# Patient Record
Sex: Male | Born: 1975 | Race: Black or African American | Hispanic: No | Marital: Single | Smoking: Current every day smoker
Health system: Southern US, Community
[De-identification: ages and names within clinical notes are randomized; demographics above are authoritative.]

## PROBLEM LIST (undated history)

## (undated) DIAGNOSIS — J40 Bronchitis, not specified as acute or chronic: Secondary | ICD-10-CM

## (undated) DIAGNOSIS — F410 Panic disorder [episodic paroxysmal anxiety] without agoraphobia: Secondary | ICD-10-CM

---

## 2013-07-09 ENCOUNTER — Encounter (HOSPITAL_COMMUNITY): Payer: Self-pay | Admitting: Emergency Medicine

## 2013-07-09 ENCOUNTER — Emergency Department (HOSPITAL_COMMUNITY)
Admission: EM | Admit: 2013-07-09 | Discharge: 2013-07-09 | Disposition: A | Discharge status: Home / Self Care | Payer: Self-pay | Attending: Emergency Medicine | Admitting: Emergency Medicine

## 2013-07-09 DIAGNOSIS — F17200 Nicotine dependence, unspecified, uncomplicated: Secondary | ICD-10-CM | POA: Insufficient documentation

## 2013-07-09 DIAGNOSIS — R0602 Shortness of breath: Secondary | ICD-10-CM | POA: Insufficient documentation

## 2013-07-09 DIAGNOSIS — Z8659 Personal history of other mental and behavioral disorders: Secondary | ICD-10-CM | POA: Insufficient documentation

## 2013-07-09 DIAGNOSIS — Z8709 Personal history of other diseases of the respiratory system: Secondary | ICD-10-CM | POA: Insufficient documentation

## 2013-07-09 HISTORY — DX: Panic disorder (episodic paroxysmal anxiety): F41.0

## 2013-07-09 HISTORY — DX: Bronchitis, not specified as acute or chronic: J40

## 2013-07-09 NOTE — ED Notes (Signed)
Pt alert, NAD, calm, interactive, resps e/u, speaking in clear complete sentences, ambulatory with steady gait, denies sx or complaints, (denies: recent illness, cough, congestion, cold sx, fever, nvd, pain, sob, dizziness or other sx), "think it was a panic attack, h/o same, last only a few minutes". No meds PTA. Friends at [employer].

## 2013-07-09 NOTE — ED Provider Notes (Signed)
CSN: [id number]     Arrival date & time 07/09/13  4098 History   First MD Initiated Contact with Patient 07/09/13 [phone]     Chief Complaint  Patient presents with  . Shortness of Breath   (Consider location/radiation/quality/duration/timing/severity/associated sxs/prior Treatment) HPI Comments: 37 yo AA male presents to ER with cc of SOB which has resolved.  Onset @ approx 2330 after pt got off the phone and received bad news.   No FHx of SCD or cardiac disease at young age.  No recent illness.  No trauma.  No CP.  Minimal clot risk factors except for smoking.    Patient is a 37 y.o. male presenting with shortness of breath. The history is provided by the patient.  Shortness of Breath Severity:  Mild Onset quality:  Gradual Duration:  7 hours Timing:  Intermittent Progression:  Resolved Chronicity:  New Context: emotional upset   Context: not activity, not animal exposure, not fumes, not known allergens, not occupational exposure, not pollens, not strong odors and not URI   Relieved by:  Nothing Worsened by:  Nothing tried Ineffective treatments:  None tried Associated symptoms: no abdominal pain, no chest pain, no claudication, no cough, no diaphoresis, no fever, no headaches, no hemoptysis, no neck pain, no sore throat, no sputum production, no syncope and no wheezing   Risk factors: tobacco use   Risk factors: no recent alcohol use, no family hx of DVT, no hx of cancer, no hx of PE/DVT, no obesity, no oral contraceptive use, no prolonged immobilization and no recent surgery     Past Medical History  Diagnosis Date  . Panic attack   . Bronchitis    History reviewed. No pertinent past surgical history. History reviewed. No pertinent family history. History  Substance Use Topics  . Smoking status: Current Every Day Smoker  . Smokeless tobacco: Not on file  . Alcohol Use: Yes     Comment: socially    Review of Systems  Constitutional: Negative for fever, chills,  diaphoresis, activity change, appetite change and fatigue.  HENT: Negative for sore throat and neck pain.   Eyes: Negative.   Respiratory: Positive for shortness of breath. Negative for apnea, cough, hemoptysis, sputum production, choking, chest tightness, wheezing and stridor.   Cardiovascular: Negative for chest pain, claudication and syncope.  Gastrointestinal: Negative for abdominal pain.  Endocrine: Negative.   Genitourinary: Negative.   Allergic/Immunologic: Negative.   Neurological: Negative.  Negative for dizziness, tremors, seizures, syncope, facial asymmetry, speech difficulty, light-headedness, numbness and headaches.  Hematological: Negative.   Psychiatric/Behavioral: Negative.     Allergies  Review of patient's allergies indicates no known allergies.  Home Medications  No current outpatient prescriptions on file. BP 130/63  Pulse 71  Temp(Src) 98 F (36.7 C) (Oral)  Resp 23  SpO2 93% Physical Exam  Constitutional: He is oriented to person, place, and time. He appears well-developed and well-nourished. No distress.  HENT:  Head: Normocephalic and atraumatic.  Eyes: Conjunctivae and EOM are normal. Pupils are equal, round, and reactive to light.  Neck: Normal range of motion. Neck supple.  Cardiovascular: Normal rate, regular rhythm, normal heart sounds and intact distal pulses.  Exam reveals no gallop and no friction rub.   No murmur heard. Pulmonary/Chest: Effort normal and breath sounds normal.  Abdominal: Soft. Bowel sounds are normal.  Musculoskeletal: Normal range of motion. He exhibits no edema and no tenderness.  No calf edema or TTP, symmetric pulses; no c/c/e  Neurological: He is alert  and oriented to person, place, and time. He has normal reflexes.  Skin: Skin is warm and dry. He is not diaphoretic.    Date: 07/09/2013@ 0540   Rate: 85  Rhythm: normal sinus rhythm  QRS Axis: normal  Intervals: normal  ST/T Wave abnormalities: normal  Conduction  Disutrbances:none  Narrative Interpretation:   Old EKG Reviewed: none available   ED Course  Procedures (including critical care time) Labs Review Labs Reviewed - No data to display Imaging Review No results found.  MDM   1. Shortness of breath    37 year old African American male presents emergency department with results shortness of breath.  Patient has minimal risk factors for ACS, PE, pneumonia, or other emergent etiology. The symptoms occurred after he received a phone call during which she received bad news about one of his children. He and his family members and walked to the emergency department for approximately 2 hours and his symptoms resolved during this walk. He is PERC negative.  His EKG is normal. His physical exam is normal. He has no history of cough and his lung exam is normal therefore I did not obtain a chest x-ray.  The patient also requested to not receive a chest x-ray. At this point the patient is stable for discharge to home. ER precautions were given for chest pain, shortness of breath, productive cough, syncope, or other concerning symptoms. I suspect his symptoms are likely related to anxiety, however they have completely resolved and he has been symptom-free for the last several hours.  Pt and family agree with the plan.     Darlys Gales, MD 07/09/13 [phone]

## 2013-07-09 NOTE — ED Notes (Signed)
Patient with shortness of breath since 1145 last evening.  Patient denies any chest pain.  Patient states he is lightheaded and broke out in a sweat.  Patient states he felt his heart racing, but all has now resolved.

## 2013-07-09 NOTE — ED Notes (Signed)
Report received, assumed care.  

## 2015-01-10 ENCOUNTER — Encounter (HOSPITAL_COMMUNITY): Payer: Self-pay | Admitting: *Deleted

## 2015-01-10 ENCOUNTER — Emergency Department (HOSPITAL_COMMUNITY)
Admission: EM | Admit: 2015-01-10 | Discharge: 2015-01-10 | Disposition: A | Discharge status: Home / Self Care | Payer: Self-pay | Attending: Emergency Medicine | Admitting: Emergency Medicine

## 2015-01-10 DIAGNOSIS — Z720 Tobacco use: Secondary | ICD-10-CM | POA: Insufficient documentation

## 2015-01-10 DIAGNOSIS — Z8709 Personal history of other diseases of the respiratory system: Secondary | ICD-10-CM | POA: Insufficient documentation

## 2015-01-10 DIAGNOSIS — Z202 Contact with and (suspected) exposure to infections with a predominantly sexual mode of transmission: Secondary | ICD-10-CM | POA: Insufficient documentation

## 2015-01-10 DIAGNOSIS — Z8659 Personal history of other mental and behavioral disorders: Secondary | ICD-10-CM | POA: Insufficient documentation

## 2015-01-10 MED ORDER — METRONIDAZOLE 500 MG PO TABS
2000.0000 mg | ORAL_TABLET | Freq: Once | ORAL | Status: AC
  Administered 2015-01-10: 2000 mg via ORAL
  Filled 2015-01-10: qty 4

## 2015-01-10 MED ORDER — CEFTRIAXONE SODIUM 250 MG IJ SOLR
250.0000 mg | Freq: Once | INTRAMUSCULAR | Status: AC
  Administered 2015-01-10: 250 mg via INTRAMUSCULAR
  Filled 2015-01-10: qty 250

## 2015-01-10 MED ORDER — AZITHROMYCIN 250 MG PO TABS
1000.0000 mg | ORAL_TABLET | Freq: Once | ORAL | Status: AC
  Administered 2015-01-10: 1000 mg via ORAL
  Filled 2015-01-10: qty 4

## 2015-01-10 NOTE — ED Provider Notes (Signed)
CSN: [id number]     Arrival date & time 01/10/15  1118 History  This chart was scribed for non-physician practitioner, Trixie DredgeEmily Malka Bocek, PA-C, working with Gilda Creasehristopher J Pollina, MD, by Lionel DecemberHatice Demirci, ED Scribe. This patient was seen in room TR07C/TR07C and the patient's care was started at 12:39 PM.   First MD Initiated Contact with Patient 01/10/15 1142     Chief Complaint  Patient presents with  . Exposure to STD     (Consider location/radiation/quality/duration/timing/severity/associated sxs/prior Treatment) HPI  HPI Comments: Fabiola BackerJuvone Swetz is a 39 y.o. male who presents to the Emergency Department for an STD testing.  Patient notes that he is concerned that he was exposed to gonorrhea and trichomonas.  States he heard from others that someone he had sex with was infected.  Patient denies seeing any bumps spots or ulcers.  Patient denies fevers, abdominal pain, testicular pain, fever, nausea/vomitting.  Patient has not eaten today. Patient has no other complaints today.     Past Medical History  Diagnosis Date  . Panic attack   . Bronchitis    History reviewed. No pertinent past surgical history. History reviewed. No pertinent family history. History  Substance Use Topics  . Smoking status: Current Every Day Smoker  . Smokeless tobacco: Not on file  . Alcohol Use: Yes     Comment: socially    Review of Systems  Constitutional: Negative for fever and chills.  Genitourinary: Negative for dysuria, urgency, penile swelling, scrotal swelling, difficulty urinating, genital sores, penile pain and testicular pain.      Allergies  Review of patient's allergies indicates no known allergies.  Home Medications   Prior to Admission medications   Not on File   BP 159/93 mmHg  Pulse 78  Temp(Src) 98 F (36.7 C) (Oral)  Resp 18  SpO2 99% Physical Exam  Constitutional: He appears well-developed and well-nourished. No distress.  HENT:  Head: Normocephalic and atraumatic.  Neck:  Neck supple.  Pulmonary/Chest: Effort normal.  Genitourinary:  Penis is circumsized Non tender  Testicle without erythema edema or mass No inguinal lymphadenopathy   Neurological: He is alert.  Skin: He is not diaphoretic.  Nursing note and vitals reviewed.   ED Course  Procedures (including critical care time) DIAGNOSTIC STUDIES: Oxygen Saturation is 99% on RA, normal by my interpretation.    COORDINATION OF CARE: 12:43 PM Discussed treatment plan with patient at beside, the patient agrees with the plan and has no further questions at this time.   Labs Review Labs Reviewed  HIV ANTIBODY (ROUTINE TESTING)  RPR  GC/CHLAMYDIA PROBE AMP ([place])    Imaging Review No results found.   EKG Interpretation None      MDM   Final diagnoses:  Possible exposure to STD   Afebrile, nontoxic patient with request for testing and treatment for STDs.  Pt is asymptomatic.  Treated in ED for GC/Chlam, trichomonas.  D/C home with health department follow up.  Discussed result, findings, treatment, and follow up  with patient.  Pt given return precautions.  Pt verbalizes understanding and agrees with plan.       I personally performed the services described in this documentation, which was scribed in my presence. The recorded information has been reviewed and is accurate.    Trixie Dredgemily Chyane Greer, PA-C 01/10/15 1651  Gilda Creasehristopher J Pollina, MD 01/13/15 [phone]

## 2015-01-10 NOTE — ED Notes (Signed)
Pt reports possible exposure to std and wants to be checked but denies any symptoms.

## 2015-01-10 NOTE — Discharge Instructions (Signed)
Read the information below.  You may return to the Emergency Department at any time for worsening condition or any new symptoms that concern you.     Sexually Transmitted Disease A sexually transmitted disease (STD) is a disease or infection often passed to another person during sex. However, STDs can be passed through nonsexual ways. An STD can be passed through:  Spit (saliva).  Semen.  Blood.  Mucus from the vagina.  Pee (urine). HOW CAN I LESSEN MY CHANCES OF GETTING AN STD?  Use:  Latex condoms.  Water-soluble lubricants with condoms. Do not use petroleum jelly or oils.  Dental dams. These are small pieces of latex that are used as a barrier during oral sex.  Avoid having more than one sex partner.  Do not have sex with someone who has other sex partners.  Do not have sex with anyone you do not know or who is at high risk for an STD.  Avoid risky sex that can break your skin.  Do not have sex if you have open sores on your mouth or skin.  Avoid drinking too much alcohol or taking illegal drugs. Alcohol and drugs can affect your good judgment.  Avoid oral and anal sex acts.  Get shots (vaccines) for HPV and hepatitis.  If you are at risk of being infected with HIV, it is advised that you take a certain medicine daily to prevent HIV infection. This is called pre-exposure prophylaxis (PrEP). You may be at risk if:  You are a man who has sex with other men (MSM).  You are attracted to the opposite sex (heterosexual) and are having sex with more than one partner.  You take drugs with a needle.  You have sex with someone who has HIV.  Talk with your doctor about if you are at high risk of being infected with HIV. If you begin to take PrEP, get tested for HIV first. Get tested every 3 months for as long as you are taking PrEP. WHAT SHOULD I DO IF I THINK I HAVE AN STD?  See your doctor.  Tell your sex partner(s) that you have an STD. They should be tested and  treated.  Do not have sex until your doctor says it is okay. WHEN SHOULD I GET HELP? Get help right away if:  You have bad belly (abdominal) pain.  You are a man and have puffiness (swelling) or pain in your testicles.  You are a woman and have puffiness in your vagina. Document Released: 11/20/2004 Document Revised: 10/18/2013 Document Reviewed: 04/08/2013 [facility] Patient Information 2015 [place]. This information is not intended to replace advice given to you by your health care provider. Make sure you discuss any questions you have with your health care provider.  Safe Sex Safe sex is about reducing the risk of giving or getting a sexually transmitted disease (STD). STDs are spread through sexual contact involving the genitals, mouth, or rectum. Some STDs can be cured and others cannot. Safe sex can also prevent unintended pregnancies.  WHAT ARE SOME SAFE SEX PRACTICES?  Limit your sexual activity to only one partner who is having sex with only you.  Talk to your partner about his or her past partners, past STDs, and drug use.  Use a condom every time you have sexual intercourse. This includes vaginal, oral, and anal sexual activity. Both females and males should wear condoms during oral sex. Only use latex or polyurethane condoms and water-based lubricants. Using petroleum-based lubricants or  oils to lubricate a condom will weaken the condom and increase the chance that it will break. The condom should be in place from the beginning to the end of sexual activity. Wearing a condom reduces, but does not completely eliminate, your risk of getting or giving an STD. STDs can be spread by contact with infected body fluids and skin.  Get vaccinated for hepatitis B and HPV.  Avoid alcohol and recreational drugs, which can affect your judgment. You may forget to use a condom or participate in high-risk sex.  For females, avoid douching after sexual intercourse. Douching can spread an  infection farther into the reproductive tract.  Check your body for signs of sores, blisters, rashes, or unusual discharge. See your health care provider if you notice any of these signs.  Avoid sexual contact if you have symptoms of an infection or are being treated for an STD. If you or your partner has herpes, avoid sexual contact when blisters are present. Use condoms at all other times.  If you are at risk of being infected with HIV, it is recommended that you take a prescription medicine daily to prevent HIV infection. This is called pre-exposure prophylaxis (PrEP). You are considered at risk if:  You are a man who has sex with other men (MSM).  You are a heterosexual man or woman who is sexually active with more than one partner.  You take drugs by injection.  You are sexually active with a partner who has HIV.  Talk with your health care provider about whether you are at high risk of being infected with HIV. If you choose to begin PrEP, you should first be tested for HIV. You should then be tested every 3 months for as long as you are taking PrEP.  See your health care provider for regular screenings, exams, and tests for other STDs. Before having sex with a new partner, each of you should be screened for STDs and should talk about the results with each other. WHAT ARE THE BENEFITS OF SAFE SEX?   There is less chance of getting or giving an STD.  You can prevent unwanted or unintended pregnancies.  By discussing safe sex concerns with your partner, you may increase feelings of intimacy, comfort, trust, and honesty between the two of you. Document Released: 11/20/2004 Document Revised: 02/27/2014 Document Reviewed: 04/05/2012 [facility] Patient Information 2015 [place], [place]. This information is not intended to replace advice given to you by your health care provider. Make sure you discuss any questions you have with your health care provider.

## 2015-01-11 LAB — GC/CHLAMYDIA PROBE AMP (~~LOC~~) NOT AT ARMC
CHLAMYDIA, DNA PROBE: NEGATIVE
NEISSERIA GONORRHEA: NEGATIVE

## 2015-01-11 LAB — RPR: RPR Ser Ql: NONREACTIVE

## 2015-01-11 LAB — HIV ANTIBODY (ROUTINE TESTING W REFLEX): HIV Screen 4th Generation wRfx: NONREACTIVE

## 2017-04-10 ENCOUNTER — Emergency Department (HOSPITAL_COMMUNITY)
Admission: EM | Admit: 2017-04-10 | Discharge: 2017-04-10 | Disposition: A | Discharge status: Home / Self Care | Payer: Self-pay | Attending: Emergency Medicine | Admitting: Emergency Medicine

## 2017-04-10 ENCOUNTER — Emergency Department (HOSPITAL_COMMUNITY): Payer: Self-pay

## 2017-04-10 DIAGNOSIS — Y9367 Activity, basketball: Secondary | ICD-10-CM | POA: Insufficient documentation

## 2017-04-10 DIAGNOSIS — Y998 Other external cause status: Secondary | ICD-10-CM | POA: Insufficient documentation

## 2017-04-10 DIAGNOSIS — M25511 Pain in right shoulder: Secondary | ICD-10-CM

## 2017-04-10 DIAGNOSIS — F17200 Nicotine dependence, unspecified, uncomplicated: Secondary | ICD-10-CM | POA: Insufficient documentation

## 2017-04-10 DIAGNOSIS — Y92310 Basketball court as the place of occurrence of the external cause: Secondary | ICD-10-CM | POA: Insufficient documentation

## 2017-04-10 DIAGNOSIS — W1830XA Fall on same level, unspecified, initial encounter: Secondary | ICD-10-CM | POA: Insufficient documentation

## 2017-04-10 DIAGNOSIS — S43401A Unspecified sprain of right shoulder joint, initial encounter: Secondary | ICD-10-CM | POA: Insufficient documentation

## 2017-04-10 MED ORDER — CYCLOBENZAPRINE HCL 10 MG PO TABS: 10.0000 mg | ORAL_TABLET | Freq: Two times a day (BID) | ORAL | 0 refills | Status: DC | PRN

## 2017-04-10 MED ORDER — OXYCODONE-ACETAMINOPHEN 5-325 MG PO TABS
1.0000 | ORAL_TABLET | ORAL | Status: DC | PRN
  Administered 2017-04-10: 1 via ORAL
  Filled 2017-04-10: qty 1

## 2017-04-10 NOTE — ED Triage Notes (Signed)
Pt c/o right shoulder pain onset yesterday after landing on shoulder during basketball game. No numbness or tingling. Right shoulder point tenderness.

## 2017-04-10 NOTE — Discharge Instructions (Signed)
He can use the arm sling in her out for support. Take it off flare home and do range of motion exercises of the shoulder does not become frozen.   You can take Tylenol or ibuprofen as needed for pain.  Take Flexeril as prescribed. This medication will make you drowsy so do not drive or drink alcohol when taking it.  Apply ice to the affected area for the pain.  Follow-up with one of the referred clinics for further evaluation and establish primary care.  Return the emergency Department for any worsening pain, swelling, numbness/weakness of the arm, fever, redness or any other worsening or concerning symptoms.   If you do not have a primary care doctor you see regularly, please you the list below. Please call them to arrange for follow-up.    No Primary Care Doctor Call [facility]  [phone] Other agencies that provide inexpensive medical care    [facility]  [phone]    [facility]  [phone]    [facility]  [phone]    [facility]  [phone]    [facility]  [phone]    [facility]  [phone]

## 2017-04-10 NOTE — ED Provider Notes (Signed)
[facility]-EMERGENCY DEPT Provider Note   CSN: [id number] Arrival date & time: 04/10/17  1439  By signing my name below, I, Linna Darner, attest that this documentation has been prepared under the direction and in the presence of Graciella Freer, PA-C. Electronically Signed: Linna Darner, Scribe. 04/10/2017. 3:55 PM.  History   Chief Complaint Chief Complaint  Patient presents with  . Shoulder Pain   The history is provided by the patient. No language interpreter was used.    HPI Comments: Willie Lawrence is a 41 y.o. male who presents to the Emergency Department complaining of constant, severe right shoulder pain since yesterday. He states fell onto the court while playing basketball yesterday and landed on his right shoulder. Patient states that several players subsequently landed on top of him. No head trauma or LOC. He endorses significant right shoulder pain that is worse with raising his right upper extremity. No medications or treatments tried PTA. Patient denies numbness/tingling, bruising, open wounds, or any other associated symptoms.  Past Medical History:  Diagnosis Date  . Bronchitis   . Panic attack     There are no active problems to display for this patient.   No past surgical history on file.     Home Medications    Prior to Admission medications   Medication Sig Start Date End Date Taking? Authorizing Provider  cyclobenzaprine (FLEXERIL) 10 MG tablet Take 1 tablet (10 mg total) by mouth 2 (two) times daily as needed for muscle spasms. 04/10/17   Maxwell Caul, PA-C    Family History No family history on file.  Social History Social History  Substance Use Topics  . Smoking status: Current Every Day Smoker  . Smokeless tobacco: Not on file  . Alcohol use Yes     Comment: socially     Allergies   Patient has no known allergies.   Review of Systems Review of Systems  Musculoskeletal: Positive for myalgias.  Skin: Negative for color change and  wound.  Neurological: Negative for syncope and numbness.  All other systems reviewed and are negative.  Physical Exam Updated Vital Signs BP 119/79   Pulse 86   Temp 98.2 F (36.8 C) (Oral)   Resp 16   SpO2 98%   Physical Exam  Constitutional: He appears well-developed and well-nourished.  Sitting comfortably on examination table  HENT:  Head: Normocephalic and atraumatic.  Eyes: Conjunctivae and EOM are normal. Pupils are equal, round, and reactive to light. Right eye exhibits no discharge. Left eye exhibits no discharge. No scleral icterus.  Cardiovascular:  Pulses:      Radial pulses are 2+ on the right side, and 2+ on the left side.  Pulmonary/Chest: Effort normal.  Musculoskeletal:  Full range of motion of left upper extremity without difficulty. No decreased strength. Patient is able to abduct his right upper extremity to about 100-110 before extremity pain. He is able to resist against gravity now difficulty. Bilateral grip strength intact. No deformity or crepitus felt. No deformity of the clavicle bilaterally. Negative Hawkins, Neer's, empty can test, lift off test, Bilaterally. No tenderness palpation to elbow and wrist of right upper extremity.  Neurological: He is alert.  Skin: Skin is warm and dry.  No overlying warmth, erythema, ecchymosis to the right shoulder.  Psychiatric: He has a normal mood and affect. His speech is normal and behavior is normal.  Nursing note and vitals reviewed.  ED Treatments / Results  Labs (all labs ordered are listed, but only abnormal results  are displayed) Labs Reviewed - No data to display  EKG  EKG Interpretation None       Radiology Dg Shoulder Right  Result Date: 04/10/2017 CLINICAL DATA:  Pain following fall during basketball game EXAM: RIGHT SHOULDER - 2+ VIEW COMPARISON:  None. FINDINGS: Frontal, Y scapular, and axillary images were obtained. No fracture or dislocation. Joint spaces appear normal. No erosive change or  intra-articular calcification. Visualized right lung is clear. IMPRESSION: No fracture or dislocation.  No evident arthropathy. Electronically Signed   By: Bretta BangWilliam  Woodruff III M.D.   On: 04/10/2017 15:17    Procedures Procedures (including critical care time)  DIAGNOSTIC STUDIES: Oxygen Saturation is 98% on RA, normal by my interpretation.    COORDINATION OF CARE: 3:54 PM Discussed treatment plan with pt at bedside and pt agreed to plan.  Medications Ordered in ED Medications  oxyCODONE-acetaminophen (PERCOCET/ROXICET) 5-325 MG per tablet 1 tablet (1 tablet Oral Given 04/10/17 1518)     Initial Impression / Assessment and Plan / ED Course  I have reviewed the triage vital signs and the nursing notes.  Pertinent labs & imaging results that were available during my care of the patient were reviewed by me and considered in my medical decision making (see chart for details).  41 year old male who presents with right shoulder pain after a fall yesterday. No deformity or crepitus noted on exam. Concern for strain versus fracture versus dislocation. Straight/physical exam are not concerning for rotator cuff pathology or septic joint. X-rays ordered at triage. Analgesics given in triage.   Patient X-Ray negative for obvious fracture or dislocation. Patient given sling for supportive relief while in ED. instructed him to apply range of motion exercises to the shoulder to prevent frozen shoulder. Conservative therapy recommended and discussed. Provided patient with a list of clinic resources to use if he does not have a PCP. Instructed to call them today to arrange follow-up in the next 24-48 hours. Strict return precautions discussed. Patient expresses understanding and agreement to plan. Final Clinical Impressions(s) / ED Diagnoses   Final diagnoses:  Sprain of right shoulder, unspecified shoulder sprain type, initial encounter  Acute pain of right shoulder    New Prescriptions New  Prescriptions   CYCLOBENZAPRINE (FLEXERIL) 10 MG TABLET    Take 1 tablet (10 mg total) by mouth 2 (two) times daily as needed for muscle spasms.   I personally performed the services described in this documentation, which was scribed in my presence. The recorded information has been reviewed and is accurate.    Maxwell CaulLayden, Danni Shima A, PA-C 04/10/17 1644    Raeford RazorKohut, Stephen, MD 04/17/17 1145

## 2018-01-22 ENCOUNTER — Emergency Department (HOSPITAL_COMMUNITY): Payer: Self-pay

## 2018-01-22 ENCOUNTER — Emergency Department (HOSPITAL_COMMUNITY)
Admission: EM | Admit: 2018-01-22 | Discharge: 2018-01-22 | Discharge status: Against Medical Advice | Payer: Self-pay | Attending: Emergency Medicine | Admitting: Emergency Medicine

## 2018-01-22 ENCOUNTER — Encounter (HOSPITAL_COMMUNITY): Payer: Self-pay | Admitting: Emergency Medicine

## 2018-01-22 ENCOUNTER — Other Ambulatory Visit: Payer: Self-pay

## 2018-01-22 DIAGNOSIS — F17200 Nicotine dependence, unspecified, uncomplicated: Secondary | ICD-10-CM | POA: Insufficient documentation

## 2018-01-22 DIAGNOSIS — J9601 Acute respiratory failure with hypoxia: Secondary | ICD-10-CM | POA: Insufficient documentation

## 2018-01-22 LAB — CBC WITH DIFFERENTIAL/PLATELET
Basophils Absolute: 0 10*3/uL (ref 0.0–0.1)
Basophils Relative: 0 %
Eosinophils Absolute: 0.1 10*3/uL (ref 0.0–0.7)
Eosinophils Relative: 1 %
HCT: 43 % (ref 39.0–52.0)
Hemoglobin: 15.3 g/dL (ref 13.0–17.0)
Lymphocytes Relative: 9 %
Lymphs Abs: 1.1 10*3/uL (ref 0.7–4.0)
MCH: 32.8 pg (ref 26.0–34.0)
MCHC: 35.6 g/dL (ref 30.0–36.0)
MCV: 92.1 fL (ref 78.0–100.0)
Monocytes Absolute: 1.1 10*3/uL — ABNORMAL HIGH (ref 0.1–1.0)
Monocytes Relative: 9 %
Neutro Abs: 9.8 10*3/uL — ABNORMAL HIGH (ref 1.7–7.7)
Neutrophils Relative %: 81 %
Platelets: 214 10*3/uL (ref 150–400)
RBC: 4.67 MIL/uL (ref 4.22–5.81)
RDW: 12.4 % (ref 11.5–15.5)
WBC: 12 10*3/uL — ABNORMAL HIGH (ref 4.0–10.5)

## 2018-01-22 LAB — BASIC METABOLIC PANEL
Anion gap: 8 (ref 5–15)
BUN: 19 mg/dL (ref 6–20)
CO2: 24 mmol/L (ref 22–32)
Calcium: 8.8 mg/dL — ABNORMAL LOW (ref 8.9–10.3)
Chloride: 107 mmol/L (ref 101–111)
Creatinine, Ser: 1.28 mg/dL — ABNORMAL HIGH (ref 0.61–1.24)
GFR calc Af Amer: >60 mL/min (ref >60)
GFR calc non Af Amer: >60 mL/min (ref >60)
Glucose, Bld: 119 mg/dL — ABNORMAL HIGH (ref 65–99)
Potassium: 3.5 mmol/L (ref 3.5–5.1)
Sodium: 139 mmol/L (ref 135–145)

## 2018-01-22 LAB — D-DIMER, QUANTITATIVE (NOT AT ARMC): D-Dimer, Quant: 0.27 ug/mL-FEU (ref 0.00–0.50)

## 2018-01-22 MED ORDER — ALBUTEROL (5 MG/ML) CONTINUOUS INHALATION SOLN: 10.0000 mg/h | INHALATION_SOLUTION | Freq: Once | RESPIRATORY_TRACT | Status: DC

## 2018-01-22 MED ORDER — IPRATROPIUM-ALBUTEROL 0.5-2.5 (3) MG/3ML IN SOLN
3.0000 mL | Freq: Once | RESPIRATORY_TRACT | Status: AC
  Administered 2018-01-22: 3 mL via RESPIRATORY_TRACT
  Filled 2018-01-22: qty 3

## 2018-01-22 MED ORDER — DOXYCYCLINE HYCLATE 100 MG PO TABS
100.0000 mg | ORAL_TABLET | Freq: Once | ORAL | Status: AC
  Administered 2018-01-22: 100 mg via ORAL
  Filled 2018-01-22: qty 1

## 2018-01-22 MED ORDER — PREDNISONE 10 MG PO TABS: 40.0000 mg | ORAL_TABLET | Freq: Every day | ORAL | 0 refills | Status: AC

## 2018-01-22 MED ORDER — MAGNESIUM SULFATE 2 GM/50ML IV SOLN
2.0000 g | Freq: Once | INTRAVENOUS | Status: AC
  Administered 2018-01-22: 2 g via INTRAVENOUS
  Filled 2018-01-22: qty 50

## 2018-01-22 MED ORDER — METHYLPREDNISOLONE SODIUM SUCC 125 MG IJ SOLR
125.0000 mg | Freq: Once | INTRAMUSCULAR | Status: AC
  Administered 2018-01-22: 125 mg via INTRAVENOUS
  Filled 2018-01-22: qty 2

## 2018-01-22 MED ORDER — SODIUM CHLORIDE 0.9 % IV BOLUS
500.0000 mL | Freq: Once | INTRAVENOUS | Status: AC
  Administered 2018-01-22: 500 mL via INTRAVENOUS

## 2018-01-22 MED ORDER — ALBUTEROL SULFATE HFA 108 (90 BASE) MCG/ACT IN AERS
1.0000 | INHALATION_SPRAY | Freq: Once | RESPIRATORY_TRACT | Status: AC
  Administered 2018-01-22: 1 via RESPIRATORY_TRACT
  Filled 2018-01-22: qty 6.7

## 2018-01-22 MED ORDER — DOXYCYCLINE HYCLATE 100 MG PO CAPS: 100.0000 mg | ORAL_CAPSULE | Freq: Two times a day (BID) | ORAL | 0 refills | Status: AC

## 2018-01-22 NOTE — ED Notes (Signed)
As I write this, our R.T., Aundra MilletMegan is evaluating him.

## 2018-01-22 NOTE — ED Triage Notes (Signed)
Pt brought in by EMS from home with c/o shortness of breath  Pt states he has been sick with resp sxs for the past 2 days and got worse tonight around 10pm but really bad around midnight  EMS arrived pt had wheezing in all fields and was diaphoretic  EMS gave Albuterol 5mg  neb tx and pt felt better and had wheezing in only his lung bases  Pt got off the truck to go back in his home and a couple minutes later he returned with wheezing in all fields again  Pt was given albuterol 5mg  and Atrovent 0.5mg  neb and transported to the hospital

## 2018-01-22 NOTE — Discharge Instructions (Signed)
Please take all of your antibiotics until finished!   You may develop abdominal discomfort or diarrhea from the antibiotic.  You may help offset this with probiotics which you can buy or get in yogurt. Do not eat  or take the probiotics until 2 hours after your antibiotic.    Take prednisone as prescribed beginning tomorrow.  You received the first dose in the emergency department today.  Use the albuterol inhaler as needed up to every 4 hours for shortness of breath.  Return to the emergency department immediately for any concerning signs or symptoms develop such as persisting/worsening shortness of breath, chest pains, high fevers, or if you are using the albuterol inhaler more than prescribed and are still short of breath.

## 2018-01-22 NOTE — ED Provider Notes (Signed)
[facility]-EMERGENCY DEPT Provider Note   CSN: [id number] Arrival date & time: 01/22/18  0232     History   Chief Complaint Chief Complaint  Patient presents with  . Shortness of Breath    HPI Willie Lawrence is a 42 y.o. male with history of bronchitis as an infant and panic attacks presents today for evaluation of cough and shortness of breath for 2 days.  He notes cough productive of green sputum and shortness of breath with exertion which was initially mild.  He states that he awoke at around 4 AM with sudden onset worsening of shortness of breath especially with exertion.  He denies orthopnea.  He received a breathing treatment with EMS which he states helped temporarily but again symptoms worsened as he attempted to ambulate out of the ambulance into the ED.  He denies chest pain.  He denies fevers, chills, nasal congestion, sore throat, abdominal pain, nausea, or vomiting.  He currently smokes 2-3 cigars daily and marijuana daily as well.  He denies any other recreational drug use or excessive alcohol intake.  He denies any recent travel or surgeries, no hemoptysis, no prior history of DVT or PE, and he is not on testosterone replacement therapy.  He is not on home O2 at baseline but required 3 L via nasal cannula in the ED initially.    The history is provided by the patient.    Past Medical History:  Diagnosis Date  . Bronchitis   . Panic attack     There are no active problems to display for this patient.   History reviewed. No pertinent surgical history.      Home Medications    Prior to Admission medications   Medication Sig Start Date End Date Taking? Authorizing Provider  doxycycline (VIBRAMYCIN) 100 MG capsule Take 1 capsule (100 mg total) by mouth 2 (two) times daily for 10 days. 01/22/18 02/01/18  Michela Pitcher A, PA-C  predniSONE (DELTASONE) 10 MG tablet Take 4 tablets (40 mg total) by mouth daily with breakfast for 5 days. 01/22/18 01/27/18   Jeanie Sewer, PA-C    Family History History reviewed. No pertinent family history.  Social History Social History   Tobacco Use  . Smoking status: Current Every Day Smoker  . Smokeless tobacco: Never Used  Substance Use Topics  . Alcohol use: Yes    Comment: socially  . Drug use: Yes    Types: Marijuana     Allergies   Patient has no known allergies.   Review of Systems Review of Systems  Constitutional: Negative for chills and fever.  HENT: Negative for congestion and sore throat.   Respiratory: Positive for cough and shortness of breath.   Cardiovascular: Negative for chest pain and leg swelling.  Gastrointestinal: Negative for abdominal pain, diarrhea, nausea and vomiting.  All other systems reviewed and are negative.    Physical Exam Updated Vital Signs BP (!) 162/100   Pulse 86   Temp 98.3 F (36.8 C) (Oral)   Resp (!) 25   Ht 5\' 11"  (1.803 m)   Wt 81.6 kg (180 lb)   SpO2 91%   BMI 25.10 kg/m   Physical Exam  Constitutional: He appears well-developed and well-nourished. No distress.  HENT:  Head: Normocephalic and atraumatic.  Eyes: Conjunctivae are normal. Right eye exhibits no discharge. Left eye exhibits no discharge.  Neck: Normal range of motion. Neck supple. No JVD present. No tracheal deviation present.  Cardiovascular: Normal rate, regular rhythm  and intact distal pulses.  2+ radial and DP/PT pulses bl, negative Homan's bl, no lower extremity edema  Pulmonary/Chest: Tachypnea noted. He has decreased breath sounds. He has wheezes. He has rales. He exhibits no tenderness.  Globally diminished breath sounds with some scattered wheezing and crackles in the posterior lung fields.  Patient on 3 L via nasal cannula SPO2 saturations 93%.  He desatted to 85% on room air.  Speaking in full sentences while receiving oxygen via nasal cannula.  Abdominal: Soft. Bowel sounds are normal. He exhibits no distension. There is no tenderness.  Musculoskeletal: He  exhibits no edema.       Right lower leg: Normal. He exhibits no tenderness and no edema.       Left lower leg: Normal. He exhibits no tenderness and no edema.  Neurological: He is alert.  Skin: Skin is warm and dry. No erythema.  Psychiatric: He has a normal mood and affect. His behavior is normal.  Nursing note and vitals reviewed.    ED Treatments / Results  Labs (all labs ordered are listed, but only abnormal results are displayed) Labs Reviewed  BASIC METABOLIC PANEL - Abnormal; Notable for the following components:      Result Value   Glucose, Bld 119 (*)    Creatinine, Ser 1.28 (*)    Calcium 8.8 (*)    All other components within normal limits  CBC WITH DIFFERENTIAL/PLATELET - Abnormal; Notable for the following components:   WBC 12.0 (*)    Neutro Abs 9.8 (*)    Monocytes Absolute 1.1 (*)    All other components within normal limits  D-DIMER, QUANTITATIVE (NOT AT [facility])    EKG EKG Interpretation  Date/Time:  Friday January 22 2018 02:41:23 EDT Ventricular Rate:  79 PR Interval:    QRS Duration: 96 QT Interval:  368 QTC Calculation: 422 R Axis:   87 Text Interpretation:  Sinus rhythm Normal ECG No previous ECGs available Confirmed by Paula LibraMolpus, John ([id number]) on 01/22/2018 7:11:42 AM   Radiology Dg Chest 2 View  Result Date: 01/22/2018 CLINICAL DATA:  Shortness of breath with cough and congestion 2 days. Hypoxia. EXAM: CHEST - 2 VIEW COMPARISON:  None. FINDINGS: Lungs are adequately inflated and otherwise clear. Cardiomediastinal silhouette, bones and soft tissues are within normal. IMPRESSION: No active cardiopulmonary disease. Electronically Signed   By: Elberta Fortisaniel  Boyle M.D.   On: 01/22/2018 08:32    Procedures Procedures (including critical care time)  Medications Ordered in ED Medications  ipratropium-albuterol (DUONEB) 0.5-2.5 (3) MG/3ML nebulizer solution 3 mL (3 mLs Nebulization Given 01/22/18 0737)  methylPREDNISolone sodium succinate (SOLU-MEDROL) 125 mg/2 mL  injection 125 mg (125 mg Intravenous Given 01/22/18 0737)  sodium chloride 0.9 % bolus 500 mL (0 mLs Intravenous Stopped 01/22/18 0838)  doxycycline (VIBRA-TABS) tablet 100 mg (100 mg Oral Given 01/22/18 0846)  ipratropium-albuterol (DUONEB) 0.5-2.5 (3) MG/3ML nebulizer solution 3 mL (3 mLs Nebulization Given 01/22/18 0846)  magnesium sulfate IVPB 2 g 50 mL (0 g Intravenous Stopped 01/22/18 0945)  albuterol (PROVENTIL HFA;VENTOLIN HFA) 108 (90 Base) MCG/ACT inhaler 1 puff (1 puff Inhalation Given 01/22/18 1221)     Initial Impression / Assessment and Plan / ED Course  I have reviewed the triage vital signs and the nursing notes.  Pertinent labs & imaging results that were available during my care of the patient were reviewed by me and considered in my medical decision making (see chart for details).     Patient presents today for evaluation of  productive cough for 2 days and acute onset of shortness of breath.  He is afebrile in the ED but hypoxic on room air and with ambulation.  He has some improvement on 3 L via nasal cannula.  He gets very mild temporary relief in his shortness of breath with audible nebulizer treatments and Solu-Medrol.  Chest x-ray shows no active cardiopulmonary disease but he does have adventitious breath sounds on auscultation of the lungs.  Lab work reviewed by me shows negative d-dimer, leukocytosis, and mildly elevated creatinine but no electrolyte abnormalities.  Suspect bronchitis versus pneumonia resulting in acute respiratory failure.  He was started on doxycycline in the ED empirically.  I doubt ACS/MI or PE.  Spoke with Dr. Ashley Royalty with [facility] who agrees to assume care of patient and bring him into the hospital for further evaluation and management. 12:03 PM Patient wants to leave against medical advice. Patient understands that his actions will lead to inadequate medical workup, and that he  is at risk of complications of missed diagnosis, which  includes morbidity and mortality.  Alternative options discussed.  Opportunity to change mind given. Discussion witnessed by [employer].  Patient is demonstrating good capacity to make decision. Patient understands that he  needs to return to the ER immediately if his symptoms get worse.  We will discharge him with doxycycline, prednisone burst, and albuterol inhaler.  Final Clinical Impressions(s) / ED Diagnoses   Final diagnoses:  Acute respiratory failure with hypoxia [facility])    ED Discharge Orders        Ordered    doxycycline (VIBRAMYCIN) 100 MG capsule  2 times daily     01/22/18 1206    predniSONE (DELTASONE) 10 MG tablet  Daily with breakfast     01/22/18 1206       Jeanie Sewer, PA-C 01/22/18 1638    Molpus, John, MD 01/22/18 2234

## 2018-01-22 NOTE — ED Notes (Signed)
He refuses to allow me to check his b/p at the moment.

## 2018-01-22 NOTE — ED Notes (Signed)
He is in no distress. Per his request, I allowed him to ambulate to b.r. And back, after which he was markedly short of breath.

## 2018-01-22 NOTE — ED Notes (Signed)
As I enter his room he abruptly announces "I want to leave--nobody's doing anything for me; so I'm just gonna leave and if I die, I die" [sic]. As I write this, Mina, our P.A. Is speaking with him. He is in no distress while lying in bed.

## 2018-01-22 NOTE — ED Notes (Signed)
Dr. Jerolyn CenterMathews enters his room as we are finalizing the AMA procedure. He remains adamant about his desire to leave, citing "I have two jobs, man".

## 2018-01-22 NOTE — ED Notes (Signed)
He refuses v.s. At time of d/c. He ambulates without difficulty. Bus fare is issued and I show him the shortest way to the bus stop.

## 2018-10-13 IMAGING — CR DG SHOULDER 2+V*R*
3 series · 3 of 3 positions shown · non-contrast
Comparison: None.

CLINICAL DATA: Pain following fall during basketball game

EXAM:
RIGHT SHOULDER - 2+ VIEW

[w shoulder external right]
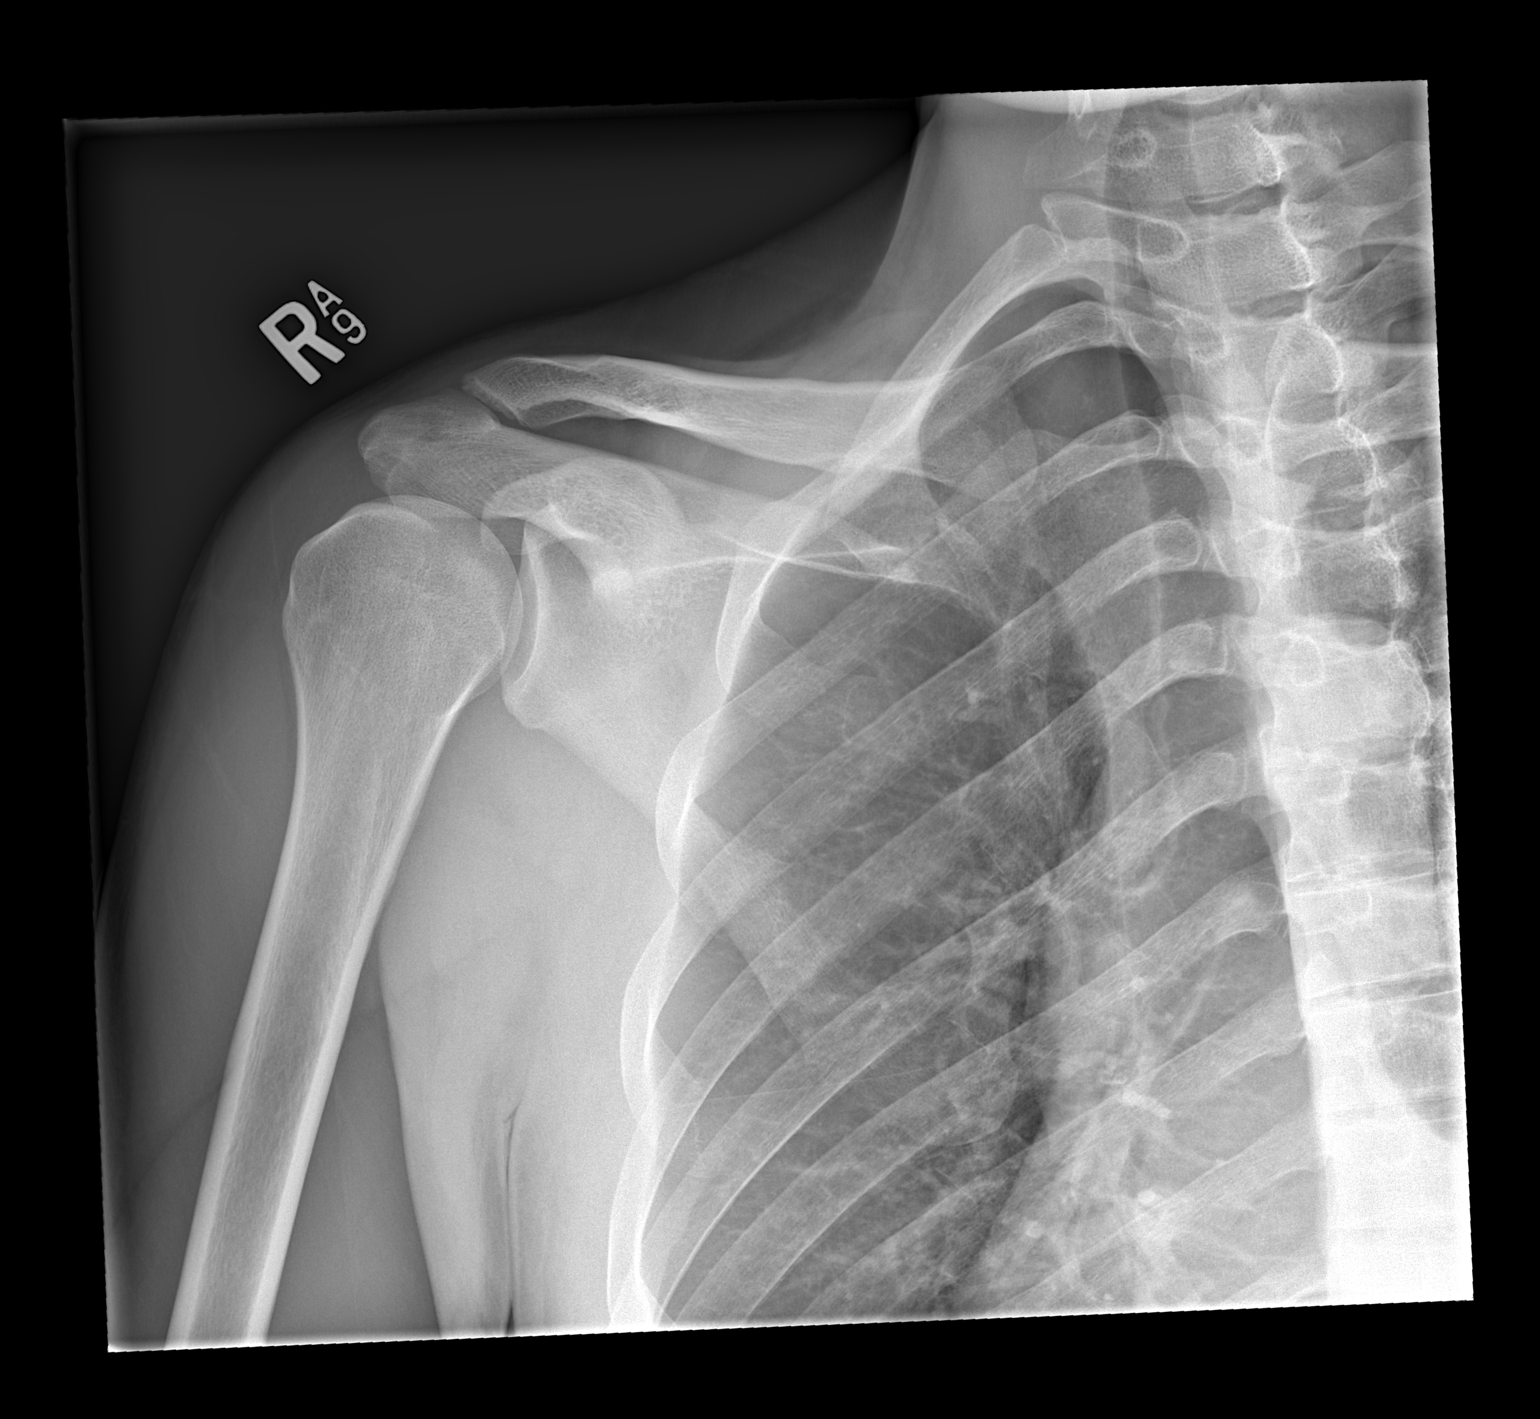

[w shoulder y-view right]
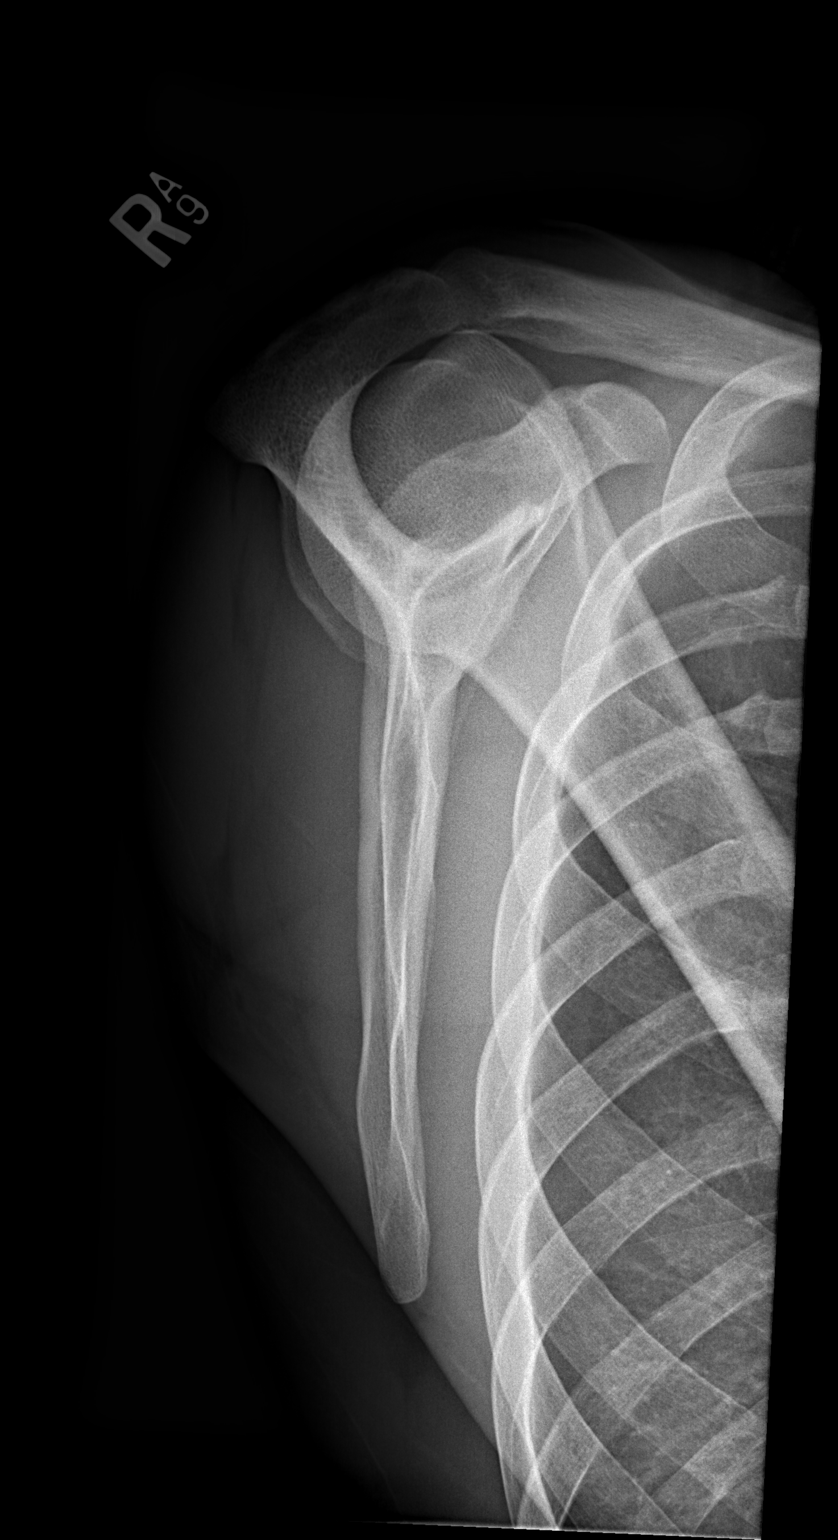

[x shoulder axillary right]
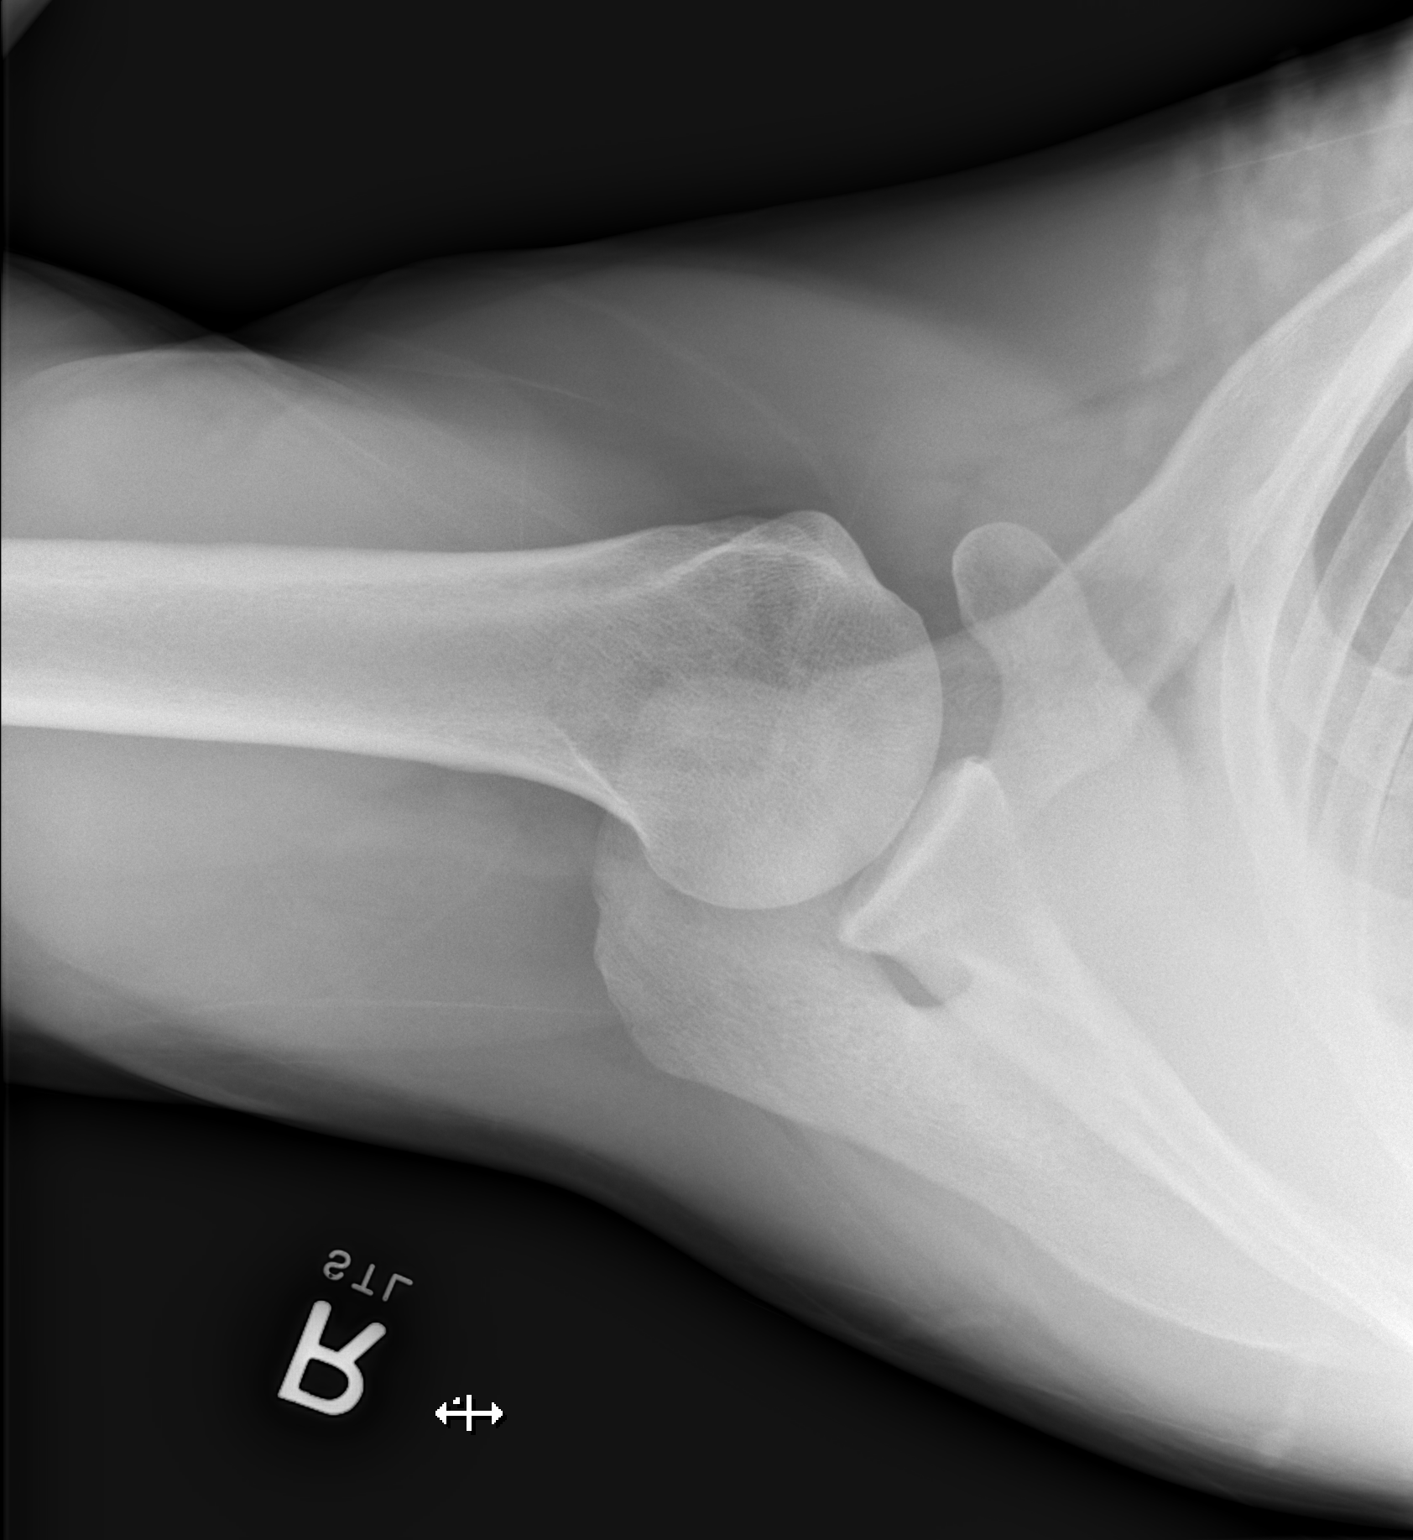

[3 of 3 positions shown; findings below may reference images not displayed]

FINDINGS: Frontal, Y scapular, and axillary images were obtained. No fracture
or dislocation. Joint spaces appear normal. No erosive change or
intra-articular calcification. Visualized right lung is clear.
IMPRESSION: No fracture or dislocation.  No evident arthropathy.

## 2021-01-02 ENCOUNTER — Ambulatory Visit (HOSPITAL_COMMUNITY)
Admission: EM | Admit: 2021-01-02 | Discharge: 2021-01-02 | Disposition: A | Discharge status: Home / Self Care | Payer: Self-pay | Attending: Emergency Medicine | Admitting: Emergency Medicine

## 2021-01-02 ENCOUNTER — Other Ambulatory Visit: Payer: Self-pay

## 2021-01-02 ENCOUNTER — Encounter (HOSPITAL_COMMUNITY): Payer: Self-pay

## 2021-01-02 DIAGNOSIS — M79671 Pain in right foot: Secondary | ICD-10-CM

## 2021-01-02 DIAGNOSIS — L84 Corns and callosities: Secondary | ICD-10-CM

## 2021-01-02 DIAGNOSIS — M79672 Pain in left foot: Secondary | ICD-10-CM

## 2021-01-02 NOTE — ED Provider Notes (Signed)
[facility]    CSN: [id number] Arrival date & time: 01/02/21  1108      History   Chief Complaint Chief Complaint  Patient presents with  . Foot Pain    HPI Willie Lawrence is a 45 y.o. male.   Patient presents with pain in both feet from multiple corns and calluses.  He states the pain has gotten worse in the past few days.  It is worse with ambulation and weightbearing.  He reports his shoes are rubbing on the areas.  He denies open wounds, drainage, fever, or other symptoms.  Treatment attempted at home with OTC callus remover pads.  No pertinent medical history.  The history is provided by the patient and medical records.    Past Medical History:  Diagnosis Date  . Bronchitis   . Panic attack     There are no problems to display for this patient.   History reviewed. No pertinent surgical history.     Home Medications    Prior to Admission medications   Not on File    Family History History reviewed. No pertinent family history.  Social History Social History   Tobacco Use  . Smoking status: Current Every Day Smoker  . Smokeless tobacco: Never Used  Vaping Use  . Vaping Use: Never used  Substance Use Topics  . Alcohol use: Yes    Comment: socially  . Drug use: Yes    Types: Marijuana     Allergies   Patient has no known allergies.   Review of Systems Review of Systems  Constitutional: Negative for chills and fever.  HENT: Negative for ear pain and sore throat.   Eyes: Negative for pain and visual disturbance.  Respiratory: Negative for cough and shortness of breath.   Cardiovascular: Negative for chest pain and palpitations.  Gastrointestinal: Negative for abdominal pain and vomiting.  Genitourinary: Negative for dysuria and hematuria.  Musculoskeletal: Negative for arthralgias and back pain.  Skin: Negative for color change and rash.       Calluses and corns on both feet  Neurological: Negative for seizures and syncope.  All  other systems reviewed and are negative.    Physical Exam Triage Vital Signs ED Triage Vitals  Enc Vitals Group     BP 01/02/21 1136 138/87     Pulse Rate 01/02/21 1136 63     Resp 01/02/21 1136 18     Temp 01/02/21 1136 98.1 F (36.7 C)     Temp Source 01/02/21 1136 Oral     SpO2 01/02/21 1136 100 %     Weight --      Height --      Head Circumference --      Peak Flow --      Pain Score 01/02/21 1133 8     Pain Loc --      Pain Edu? --      Excl. in GC? --    No data found.  Updated Vital Signs BP 138/87 (BP Location: Right Arm)   Pulse 63   Temp 98.1 F (36.7 C) (Oral)   Resp 18   SpO2 100%   Visual Acuity Right Eye Distance:   Left Eye Distance:   Bilateral Distance:    Right Eye Near:   Left Eye Near:    Bilateral Near:     Physical Exam Vitals and nursing note reviewed.  Constitutional:      General: He is not in acute distress.  Appearance: He is well-developed and well-nourished.  HENT:     Head: Normocephalic and atraumatic.     Mouth/Throat:     Mouth: Mucous membranes are moist.  Eyes:     Conjunctiva/sclera: Conjunctivae normal.  Cardiovascular:     Rate and Rhythm: Normal rate and regular rhythm.     Heart sounds: Normal heart sounds.  Pulmonary:     Effort: Pulmonary effort is normal. No respiratory distress.     Breath sounds: Normal breath sounds.  Abdominal:     Palpations: Abdomen is soft.     Tenderness: There is no abdominal tenderness.  Musculoskeletal:        General: No edema.     Cervical back: Neck supple.  Skin:    General: Skin is warm and dry.     Comments: Multiple calluses and corns on both feet.  No erythema or drainage.  Neurological:     General: No focal deficit present.     Mental Status: He is alert and oriented to person, place, and time.     Sensory: No sensory deficit.     Motor: No weakness.     Gait: Gait normal.  Psychiatric:        Mood and Affect: Mood and affect and mood normal.         Behavior: Behavior normal.      UC Treatments / Results  Labs (all labs ordered are listed, but only abnormal results are displayed) Labs Reviewed - No data to display  EKG   Radiology No results found.  Procedures Procedures (including critical care time)  Medications Ordered in UC Medications - No data to display  Initial Impression / Assessment and Plan / UC Course  I have reviewed the triage vital signs and the nursing notes.  Pertinent labs & imaging results that were available during my care of the patient were reviewed by me and considered in my medical decision making (see chart for details).   Bilateral foot pain due to corns and calluses.  Instructed patient to see a podiatrist as soon as possible.  [facility].  Instructed patient to wear shoes that are padded and comfortable when he is able.  Patient agrees to plan of care.   Final Clinical Impressions(s) / UC Diagnoses   Final diagnoses:  Bilateral foot pain  Corns and callus     Discharge Instructions     [facility] [address] [place] [address] [phone]     ED Prescriptions    None     PDMP not reviewed this encounter.   Mickie Bail, NP 01/02/21 1215

## 2021-01-02 NOTE — ED Triage Notes (Signed)
Pt c/o significant pain, callouses to bilateral feet for approx 1 month, worse the past three days. Pt has been applying "callous" remover pads w/o improvement. 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  Large area of raised yellow skin to bottom of left foot between great toe and third toe, area of approx 2.5 cm indurated with distal area of approx 4cm fluctuant. approx 1cm diameter area of hardened skin to sole of right foot center of ball of foot, hardened skin to medial aspects of great toes, and raised/dark areas to top of 2nd toes right and left foot.  Denies drainage from areas, fever, h/o diabetes.

## 2021-01-02 NOTE — Discharge Instructions (Signed)
[facility] [address] [place] [address] [phone]

## 2021-01-09 ENCOUNTER — Ambulatory Visit: Payer: Self-pay | Admitting: Podiatry

## 2023-07-23 ENCOUNTER — Encounter: Payer: Self-pay | Admitting: *Deleted

## 2023-07-23 NOTE — Progress Notes (Signed)
Pt attended 06/20/23 screening event where his b/p was 140/97. At the event, the pt did not identify a PCP or insurance; and he did not identify any SDOH insecurity. Chart review does not indicate that pt has had any PCP encounters visible in [facility]. During f/u all, pt states "I pretty much take care of myself - that honestly might be the only time I have had my blood pressure checked in my life." Pt noted he was not interested in necessarily going to a dr. Risks to heart and for stroke explained to pt r/t elevated blood pressure that is not controlled. Pt verbalized agreement to have b/p info mailed to him. Then he gave the phone to his mother who updated pt's address as being in [place], [place]. Letter sent with [facility] flyer in case pt seeks care in [place] and 3 flyers from [facility] about b/p readings, risk of high b/p, and how to reduce risk by diet, exercise, and b/p log book. Additional pt f/u to be scheduled per health equity protocol

## 2023-09-08 ENCOUNTER — Encounter: Payer: Self-pay | Admitting: *Deleted

## 2023-09-08 NOTE — Progress Notes (Signed)
Pt attended 06/20/23 screening event where his b/p was 140/97. At the event, the pt did not identify a PCP or insurance; and he did not identify any SDOH insecurity. Chart review does not indicate that pt has had any PCP encounters visible in [facility] historically, or [facility]-visible health care access encounter since 01/02/2021. Health equity team member able to contact pt directly during first event f/u and pt notes "I pretty much take care of myself - that honestly might be the only time I have had my blood pressure checked in my life." Pt noted he was not interested in necessarily going to a dr. Risks to heart and for stroke explained to pt r/t elevated blood pressure that is not controlled. Pt verbalized agreement to have b/p info mailed to him. Then he gave the phone to his mother who updated pt's address as being in [place], [place]. Letter sent with [facility] flyer in case pt seeks care in [place] and 3 flyers from [facility] about b/p readings, risk of high b/p, and how to reduce risk by diet, exercise, and b/p log book . However, letter to pt was returned from last known address and health equity team member unable to contact pt or leave vm during this 60 day f/u attempt. Additional pt f/u to be scheduled per health equity protocol to see if pt contact is possible later and/or if pt has f/u with any PCP visible to [facility].

## 2024-01-12 ENCOUNTER — Encounter: Payer: Self-pay | Admitting: *Deleted

## 2024-01-12 NOTE — Progress Notes (Signed)
 Pt attended 06/20/23 screening event where his b/p was 140/97. At the event, the pt did not identify a PCP or insurance; and he did noted sometimes being worried about having enough food. .Current 6 month post event chart review does not indicate that pt has had any PCP encounters visible in [facility] historically, or [facility]-visible health care access encounter since 01/02/2021. Health equity team member able to contact pt directly during first event f/u and pt notes "I pretty much take care of myself - that honestly might be the only time I have had my blood pressure checked in my life." Pt noted he was not interested in necessarily going to a dr. Risks to heart and for stroke explained to pt r/t elevated blood pressure that is not controlled. Pt verbalized agreement to have b/p info mailed to him. Then he gave the phone to his mother who updated pt's address as being in [place], [place]. Letter sent with [facility] flyer in case pt seeks care in [place] and 3 flyers from [facility] but letter was returned. During 6 month f/u today, pt stated he did not have a PCP, was not interested in being connected to care, and did not have any current SDOH needs. No additional health equity team support scheduled at this time.
